# Patient Record
Sex: Male | Born: 1950 | Race: White | Hispanic: No | Marital: Single | Smoking: Former smoker
Health system: Southern US, Community
[De-identification: ages and names within clinical notes are randomized; demographics above are authoritative.]

## PROBLEM LIST (undated history)

## (undated) DIAGNOSIS — M109 Gout, unspecified: Secondary | ICD-10-CM

## (undated) HISTORY — PX: POPLITEAL SYNOVIAL CYST EXCISION: SUR555

---

## 2017-04-03 ENCOUNTER — Observation Stay
Admission: EM | Admit: 2017-04-03 | Discharge: 2017-04-04 | Disposition: A | Discharge status: Home / Self Care | Payer: Medicaid Other | Attending: Specialist | Admitting: Specialist

## 2017-04-03 ENCOUNTER — Encounter: Payer: Self-pay | Admitting: Radiology

## 2017-04-03 ENCOUNTER — Emergency Department: Payer: Medicaid Other

## 2017-04-03 DIAGNOSIS — I200 Unstable angina: Secondary | ICD-10-CM | POA: Diagnosis present

## 2017-04-03 DIAGNOSIS — M109 Gout, unspecified: Secondary | ICD-10-CM | POA: Diagnosis not present

## 2017-04-03 DIAGNOSIS — I700 Atherosclerosis of aorta: Secondary | ICD-10-CM | POA: Diagnosis not present

## 2017-04-03 DIAGNOSIS — R0789 Other chest pain: Secondary | ICD-10-CM | POA: Diagnosis not present

## 2017-04-03 DIAGNOSIS — R0602 Shortness of breath: Secondary | ICD-10-CM

## 2017-04-03 DIAGNOSIS — R911 Solitary pulmonary nodule: Secondary | ICD-10-CM | POA: Insufficient documentation

## 2017-04-03 DIAGNOSIS — I350 Nonrheumatic aortic (valve) stenosis: Secondary | ICD-10-CM | POA: Insufficient documentation

## 2017-04-03 DIAGNOSIS — R600 Localized edema: Secondary | ICD-10-CM | POA: Insufficient documentation

## 2017-04-03 DIAGNOSIS — Z87891 Personal history of nicotine dependence: Secondary | ICD-10-CM | POA: Insufficient documentation

## 2017-04-03 DIAGNOSIS — R079 Chest pain, unspecified: Secondary | ICD-10-CM | POA: Diagnosis present

## 2017-04-03 HISTORY — DX: Gout, unspecified: M10.9

## 2017-04-03 LAB — BASIC METABOLIC PANEL
ANION GAP: 8 (ref 5–15)
BUN: 13 mg/dL (ref 6–20)
CALCIUM: 9.1 mg/dL (ref 8.9–10.3)
CO2: 24 mmol/L (ref 22–32)
CREATININE: 0.77 mg/dL (ref 0.61–1.24)
Chloride: 102 mmol/L (ref 101–111)
GFR calc Af Amer: >60 mL/min (ref >60)
GLUCOSE: 100 mg/dL — AB (ref 65–99)
Potassium: 3.7 mmol/L (ref 3.5–5.1)
Sodium: 134 mmol/L — ABNORMAL LOW (ref 135–145)

## 2017-04-03 LAB — CBC
HCT: 43.3 % (ref 40.0–52.0)
HEMOGLOBIN: 15.4 g/dL (ref 13.0–18.0)
MCH: 34.5 pg — ABNORMAL HIGH (ref 26.0–34.0)
MCHC: 35.6 g/dL (ref 32.0–36.0)
MCV: 97 fL (ref 80.0–100.0)
Platelets: 160 10*3/uL (ref 150–440)
RBC: 4.47 MIL/uL (ref 4.40–5.90)
RDW: 14.7 % — ABNORMAL HIGH (ref 11.5–14.5)
WBC: 5.5 10*3/uL (ref 3.8–10.6)

## 2017-04-03 LAB — TROPONIN I

## 2017-04-03 LAB — BRAIN NATRIURETIC PEPTIDE: B Natriuretic Peptide: 345 pg/mL — ABNORMAL HIGH (ref 0.0–100.0)

## 2017-04-03 MED ORDER — IOPAMIDOL (ISOVUE-370) INJECTION 76%
75.0000 mL | Freq: Once | INTRAVENOUS | Status: AC | PRN
Start: 2017-04-03 — End: 2017-04-03
  Administered 2017-04-03: 75 mL via INTRAVENOUS

## 2017-04-03 MED ORDER — ASPIRIN 81 MG PO TABS: 81.0000 mg | ORAL_TABLET | Freq: Every day | ORAL | 0 refills | Status: DC

## 2017-04-03 MED ORDER — FUROSEMIDE 20 MG PO TABS: 20.0000 mg | ORAL_TABLET | Freq: Every day | ORAL | 1 refills | Status: DC

## 2017-04-03 MED ORDER — NITROGLYCERIN 0.4 MG SL SUBL
0.4000 mg | SUBLINGUAL_TABLET | SUBLINGUAL | Status: DC | PRN
  Administered 2017-04-03 – 2017-04-04 (×2): 0.4 mg via SUBLINGUAL
  Filled 2017-04-03 (×2): qty 1

## 2017-04-03 MED ORDER — NITROGLYCERIN 0.4 MG SL SUBL: 0.4000 mg | SUBLINGUAL_TABLET | SUBLINGUAL | 0 refills | Status: DC | PRN

## 2017-04-03 MED ORDER — ASPIRIN 81 MG PO CHEW
324.0000 mg | CHEWABLE_TABLET | Freq: Once | ORAL | Status: AC
  Administered 2017-04-03: 324 mg via ORAL
  Filled 2017-04-03: qty 4

## 2017-04-03 NOTE — ED Triage Notes (Addendum)
Patient reports chest pain off/on for several days.  Reports it has woken him up during the night.  Reports pain worse with taking deep breath.  Also reports bilateral foot swelling.

## 2017-04-03 NOTE — ED Notes (Signed)
ED Provider at bedside. 

## 2017-04-03 NOTE — ED Notes (Signed)
Patient reports continued relief of symptoms

## 2017-04-03 NOTE — ED Provider Notes (Signed)
[facility] Emergency Department Provider Note  ____________________________________________  Time seen: Approximately 9:59 PM  I have reviewed the triage vital signs and the nursing notes.   HISTORY  Chief Complaint Chest Pain   HPI Dylan Ford is a 66 y.o. male with no significant PMH who presents for evaluation of CP and SOB. Patient reports several weeks of episodes of severe sudden SOB associated with L sided CP. These episodes are more frequent and night time and usually wake him up from his sleep. He has been sleeping in a recliner for the last several nights however continues to have these episodes. They also can happen during the day.He describes this episode as a sudden shortness of breath, reports that he panics and tries to take a deep breath and then developed a dull mild pain in the left side of his chest. The episode lasted until he is able to calm himself down. He has also noticed progressively worsening swelling of his bilateral lower extremities. Currently he endorses mild shortness of breath but no chest pain. No cough, no hemoptysis, no history of heart failure, patient is a former smoker, has family history of ischemic heart disease in his mother, no family history of blood clots, no recent travel or immobilization, no pain in his legs.  History reviewed. No pertinent past medical history.  There are no active problems to display for this patient.   No past surgical history on file.  Prior to Admission medications   Not on File    Allergies Patient has no known allergies.  No family history on file.  Social History Social History  Substance Use Topics  . Smoking status: Not on file  . Smokeless tobacco: Not on file  . Alcohol use Not on file    Review of Systems  Constitutional: Negative for fever. Eyes: Negative for visual changes. ENT: Negative for sore throat. Neck: No neck pain  Cardiovascular: + chest  pain. Respiratory: + shortness of breath. Gastrointestinal: Negative for abdominal pain, vomiting or diarrhea. Genitourinary: Negative for dysuria. Musculoskeletal: Negative for back pain. + b/l edema Skin: Negative for rash. Neurological: Negative for headaches, weakness or numbness. Psych: No SI or HI  ____________________________________________   PHYSICAL EXAM:  VITAL SIGNS: ED Triage Vitals  Enc Vitals Group     BP 04/03/17 2117 (!) 165/79     Pulse Rate 04/03/17 2117 75     Resp 04/03/17 2117 20     Temp 04/03/17 2117 97.9 F (36.6 C)     Temp Source 04/03/17 2117 Oral     SpO2 04/03/17 2117 99 %     Weight 04/03/17 2118 170 lb (77.1 kg)     Height 04/03/17 2118 5\' 6"  (1.676 m)     Head Circumference --      Peak Flow --      Pain Score 04/03/17 2117 4     Pain Loc --      Pain Edu? --      Excl. in GC? --     Constitutional: Alert and oriented. Well appearing and in no apparent distress. HEENT:      Head: Normocephalic and atraumatic.         Eyes: Conjunctivae are normal. Sclera is non-icteric.       Mouth/Throat: Mucous membranes are moist.       Neck: Supple with no signs of meningismus. Cardiovascular: Regular rate and rhythm. No murmurs, gallops, or rubs. 2+ symmetrical distal pulses are present in  all extremities. JVD elevated to angle of the jaw. Respiratory: Normal respiratory effort. Lungs are clear to auscultation bilaterally. No wheezes, crackles, or rhonchi.  Gastrointestinal: Soft, non tender, and non distended with positive bowel sounds. No rebound or guarding. Genitourinary: No CVA tenderness. Musculoskeletal: 2+ pitting edema bilateral lower extremity  Neurologic: Normal speech and language. Face is symmetric. Moving all extremities. No gross focal neurologic deficits are appreciated. Skin: Skin is warm, dry and intact. No rash noted. Psychiatric: Mood and affect are normal. Speech and behavior are  normal.  ____________________________________________   LABS (all labs ordered are listed, but only abnormal results are displayed)  Labs Reviewed  BASIC METABOLIC PANEL - Abnormal; Notable for the following:       Result Value   Sodium 134 (*)    Glucose, Bld 100 (*)    All other components within normal limits  CBC - Abnormal; Notable for the following:    MCH 34.5 (*)    RDW 14.7 (*)    All other components within normal limits  BRAIN NATRIURETIC PEPTIDE - Abnormal; Notable for the following:    B Natriuretic Peptide 345.0 (*)    All other components within normal limits  TROPONIN I  TROPONIN I   ____________________________________________  EKG  ED ECG REPORT I, Nita Sickle, the attending physician, personally viewed and interpreted this ECG.  Normal sinus rhythm, rate of 72, normal intervals, normal axis, T-wave inversions in inferior leads, no ST elevation. No prior for comparison.   22:12 - normal sinus rhythm, rate of 71, normal intervals, normal axis, persistent T-wave inversions in lateral leads with no ST elevation. Unchanged from earlier one. ____________________________________________  RADIOLOGY  CXR: Negative   CTA chest: Negative ____________________________________________   PROCEDURES  Procedure(s) performed: None Procedures Critical Care performed:  None ____________________________________________   INITIAL IMPRESSION / ASSESSMENT AND PLAN / ED COURSE  66 y.o. male with no significant PMH who presents for evaluation of CP and SOB. Patient reports several weeks of episodes of severe sudden SOB associated with L sided CP x few weeks. Patient has elevated JVD and b/l leg edema however lungs are clear to auscultation. Ddx ACS, CHF, renal failure, PE, pericardial effusion. BP elevated, will give sublingual nitro and repeat EKG. Will get CXR, labs.  Clinical Course as of Apr 03 2342  Sat Apr 03, 2017  2218 Patient reports full resolution of  SOB after one sublingual nitro. Repeat EKG with no changes and showing persistent TWI in lateral leads. Labs pending. CXR with no evidence of pulmonary edema.   [CV]    Clinical Course User Index [CV] Don Perking, [place], MD    _________________________ 11:36 PM on 04/03/2017 -----------------------------------------  Labs showing elevated BNP at 345. Chest x-ray with no evidence of pulmonary edema cardiomegaly. First troponin is negative. Patient remains pain-free. CTA with no evidence of PE or pericardial effusion. Will admit for unstable angina for stress test and ECHO.   Pertinent labs & imaging results that were available during my care of the patient were reviewed by me and considered in my medical decision making (see chart for details).    ____________________________________________   FINAL CLINICAL IMPRESSION(S) / ED DIAGNOSES  Final diagnoses:  Bilateral lower extremity edema  Shortness of breath  Unstable angina (HCC)      NEW MEDICATIONS STARTED DURING THIS VISIT:  There are no discharge medications for this patient.    Note:  This document was prepared using Dragon voice recognition software and may include unintentional dictation  errors.    Don Perking, [place], MD 04/03/17 [phone]

## 2017-04-03 NOTE — ED Notes (Signed)
Patient complete resolution of symptoms (SOB)

## 2017-04-03 NOTE — ED Notes (Signed)
Patient transported to X-ray 

## 2017-04-04 ENCOUNTER — Encounter: Payer: Self-pay | Admitting: Internal Medicine

## 2017-04-04 ENCOUNTER — Observation Stay
Admit: 2017-04-04 | Discharge: 2017-04-04 | Disposition: A | Discharge status: Home / Self Care | Payer: Medicaid Other | Attending: Internal Medicine | Admitting: Internal Medicine

## 2017-04-04 DIAGNOSIS — R079 Chest pain, unspecified: Secondary | ICD-10-CM | POA: Diagnosis present

## 2017-04-04 LAB — CBC
HEMATOCRIT: 41.3 % (ref 40.0–52.0)
Hemoglobin: 14.9 g/dL (ref 13.0–18.0)
MCH: 35.2 pg — ABNORMAL HIGH (ref 26.0–34.0)
MCHC: 36.1 g/dL — ABNORMAL HIGH (ref 32.0–36.0)
MCV: 97.6 fL (ref 80.0–100.0)
Platelets: 155 10*3/uL (ref 150–440)
RBC: 4.23 MIL/uL — ABNORMAL LOW (ref 4.40–5.90)
RDW: 14.6 % — ABNORMAL HIGH (ref 11.5–14.5)
WBC: 4.7 10*3/uL (ref 3.8–10.6)

## 2017-04-04 LAB — TROPONIN I

## 2017-04-04 LAB — BASIC METABOLIC PANEL
ANION GAP: 7 (ref 5–15)
BUN: 10 mg/dL (ref 6–20)
CHLORIDE: 105 mmol/L (ref 101–111)
CO2: 25 mmol/L (ref 22–32)
Calcium: 8.6 mg/dL — ABNORMAL LOW (ref 8.9–10.3)
Creatinine, Ser: 0.74 mg/dL (ref 0.61–1.24)
GFR calc non Af Amer: >60 mL/min (ref >60)
GLUCOSE: 113 mg/dL — AB (ref 65–99)
POTASSIUM: 3.8 mmol/L (ref 3.5–5.1)
Sodium: 137 mmol/L (ref 135–145)

## 2017-04-04 LAB — ECHOCARDIOGRAM COMPLETE
Height: 66 in
WEIGHTICAEL: 2660.8 [oz_av]

## 2017-04-04 MED ORDER — ENOXAPARIN SODIUM 40 MG/0.4ML ~~LOC~~ SOLN
40.0000 mg | SUBCUTANEOUS | Status: DC
  Administered 2017-04-04: 40 mg via SUBCUTANEOUS
  Filled 2017-04-04: qty 0.4

## 2017-04-04 MED ORDER — OXYCODONE HCL 5 MG PO TABS: 5.0000 mg | ORAL_TABLET | ORAL | Status: DC | PRN

## 2017-04-04 MED ORDER — ACETAMINOPHEN 650 MG RE SUPP: 650.0000 mg | Freq: Four times a day (QID) | RECTAL | Status: DC | PRN

## 2017-04-04 MED ORDER — ASPIRIN 81 MG PO TABS: 81.0000 mg | ORAL_TABLET | Freq: Every day | ORAL | 1 refills | Status: AC

## 2017-04-04 MED ORDER — ACETAMINOPHEN 325 MG PO TABS: 650.0000 mg | ORAL_TABLET | Freq: Four times a day (QID) | ORAL | Status: DC | PRN

## 2017-04-04 MED ORDER — ENOXAPARIN SODIUM 40 MG/0.4ML ~~LOC~~ SOLN: 40.0000 mg | SUBCUTANEOUS | Status: DC

## 2017-04-04 MED ORDER — NITROGLYCERIN 0.4 MG SL SUBL: 0.4000 mg | SUBLINGUAL_TABLET | SUBLINGUAL | 0 refills | Status: AC | PRN

## 2017-04-04 MED ORDER — ONDANSETRON HCL 4 MG/2ML IJ SOLN: 4.0000 mg | Freq: Four times a day (QID) | INTRAMUSCULAR | Status: DC | PRN

## 2017-04-04 MED ORDER — ONDANSETRON HCL 4 MG PO TABS: 4.0000 mg | ORAL_TABLET | Freq: Four times a day (QID) | ORAL | Status: DC | PRN

## 2017-04-04 NOTE — ED Notes (Signed)
Eileen StanfordJenna, RN notified of assigned bed

## 2017-04-04 NOTE — H&P (Signed)
[facility] [place] [facility]   PATIENT NAME: Dylan Ford    MR#:  [id number]  DATE OF BIRTH:  1950/12/23  DATE OF ADMISSION:  04/03/2017  PRIMARY CARE PHYSICIAN: Patient, No Pcp Per   REQUESTING/REFERRING PHYSICIAN: Don Perking, MD  CHIEF COMPLAINT:   Chief Complaint  Patient presents with  . Chest Pain    HISTORY OF PRESENT ILLNESS:  Dylan Ford  is a 66 y.o. male who presents with chest pain and shortness of breath. Patient states he's been having orthopnea, lower extremity swelling. In the ED his BNP was elevated. He had some cardiomegaly on imaging. Hospitalists were called for further evaluation  PAST MEDICAL HISTORY:   Past Medical History:  Diagnosis Date  . Gout     PAST SURGICAL HISTORY:   Past Surgical History:  Procedure Laterality Date  . POPLITEAL SYNOVIAL CYST EXCISION      SOCIAL HISTORY:   Social History  Substance Use Topics  . Smoking status: Former Games developer  . Smokeless tobacco: Not on file  . Alcohol use 7.2 oz/week    12 Cans of beer per week    FAMILY HISTORY:   Family History  Problem Relation Age of Onset  . Hypertension Other     DRUG ALLERGIES:  No Known Allergies  MEDICATIONS AT HOME:   Prior to Admission medications   Not on File    REVIEW OF SYSTEMS:  Review of Systems  Constitutional: Negative for chills, fever, malaise/fatigue and weight loss.  HENT: Negative for ear pain, hearing loss and tinnitus.   Eyes: Negative for blurred vision, double vision, pain and redness.  Respiratory: Positive for shortness of breath. Negative for cough and hemoptysis.   Cardiovascular: Positive for chest pain, orthopnea and leg swelling. Negative for palpitations.  Gastrointestinal: Negative for abdominal pain, constipation, diarrhea, nausea and vomiting.  Genitourinary: Negative for dysuria, frequency and hematuria.  Musculoskeletal: Negative for back pain, joint pain and neck pain.  Skin:       No  acne, rash, or lesions  Neurological: Negative for dizziness, tremors, focal weakness and weakness.  Endo/Heme/Allergies: Negative for polydipsia. Does not bruise/bleed easily.  Psychiatric/Behavioral: Negative for depression. The patient is not nervous/anxious and does not have insomnia.      VITAL SIGNS:   Vitals:   04/03/17 2118 04/03/17 2131 04/03/17 2200 04/03/17 2205  BP:   (!) 154/72 (!) 142/84  Pulse:  64 67 69  Resp:  Temp:      TempSrc:      SpO2:  98% 96% 97%  Weight: 77.1 kg (170 lb)     Height:  (1.676 m)      Wt Readings from Last 3 Encounters:  04/03/17 77.1 kg (170 lb)    PHYSICAL EXAMINATION:  Physical Exam  Vitals reviewed. Constitutional: He is oriented to person, place, and time. He appears well-developed and well-nourished. No distress.  HENT:  Head: Normocephalic and atraumatic.  Mouth/Throat: Oropharynx is clear and moist.  Eyes: Pupils are equal, round, and reactive to light. Conjunctivae and EOM are normal. No scleral icterus.  Neck: Normal range of motion. Neck supple. No JVD present. No thyromegaly present.  Cardiovascular: Normal rate, regular rhythm and intact distal pulses.  Exam reveals no gallop and no friction rub.   Murmur heard. Respiratory: Effort normal and breath sounds normal. No respiratory distress. He has no wheezes. He has no rales.  GI: Soft. Bowel sounds are normal. He exhibits no distension.  There is no tenderness.  Musculoskeletal: Normal range of motion. He exhibits edema.  No arthritis, no gout  Lymphadenopathy:    He has no cervical adenopathy.  Neurological: He is alert and oriented to person, place, and time. No cranial nerve deficit.  No dysarthria, no aphasia  Skin: Skin is warm and dry. No rash noted. No erythema.  Psychiatric: He has a normal mood and affect. His behavior is normal. Judgment and thought content normal.    LABORATORY PANEL:   CBC  Recent Labs Lab 04/03/17 2139  WBC 5.5  HGB  15.4  HCT 43.3  PLT 160   ------------------------------------------------------------------------------------------------------------------  Chemistries   Recent Labs Lab 04/03/17 2139  NA 134*  K 3.7  CL 102  CO2 24  GLUCOSE 100*  BUN 13  CREATININE 0.77  CALCIUM 9.1   ------------------------------------------------------------------------------------------------------------------  Cardiac Enzymes  Recent Labs Lab 04/03/17 2139  TROPONINI <0.03   ------------------------------------------------------------------------------------------------------------------  RADIOLOGY:  Dg Chest 2 View  Result Date: 04/03/2017 CLINICAL DATA:  Chest pain EXAM: CHEST  2 VIEW COMPARISON:  None. FINDINGS: Hyperinflation. No focal infiltrate or effusion. Normal cardiomediastinal silhouette with atherosclerosis. No pneumothorax. IMPRESSION: No active cardiopulmonary disease. Electronically Signed   By: Jasmine PangKim  Fujinaga M.D.   On: 04/03/2017 21:55   Ct Angio Chest Pe W And/or Wo Contrast  Result Date: 04/03/2017 CLINICAL DATA:  PE suspected, high pretest prob. Intermittent chest pain for days. EXAM: CT ANGIOGRAPHY CHEST WITH CONTRAST TECHNIQUE: Multidetector CT imaging of the chest was performed using the standard protocol during bolus administration of intravenous contrast. Multiplanar CT image reconstructions and MIPs were obtained to evaluate the vascular anatomy. CONTRAST:  75 cc Isovue 370 IV COMPARISON:  Chest radiograph earlier this day FINDINGS: Cardiovascular: There are no filling defects within the pulmonary arteries to suggest pulmonary embolus. Moderate atherosclerosis of the thoracic aorta and branch vessels. Heart is normal in size. There are coronary artery calcifications. Mediastinum/Nodes: Small mediastinal nodes not enlarged by size criteria. There is a prominent right hilar node measuring 11 mm. The esophagus is decompressed. Visualized thyroid gland is normal. Lungs/Pleura: Moderate  emphysema, apical predominant. There is central bronchial thickening. Perifissural right upper lobe nodule measures 5 mm image 55 series 6. No confluent consolidation. No pulmonary edema or pleural fluid. Upper Abdomen: No acute abnormality. Atherosclerosis of upper abdominal vasculature. Musculoskeletal: There are no acute or suspicious osseous abnormalities. Review of the MIP images confirms the above findings. IMPRESSION: 1. No pulmonary embolus. 2. Aortic Atherosclerosis (ICD10-I70.0) and Emphysema (ICD10-J43.9). Coronary artery calcifications. 3. Perifissural right upper lobe pulmonary nodule is intrapulmonary lymph node. Non-contrast chest CT can be considered in 12 months if patient is high-risk. This recommendation follows the consensus statement: Guidelines for Management of Incidental Pulmonary Nodules Detected on CT Images: From the Fleischner Society 2017; Radiology 2017; 284:228-243. 4. Prominent right hilar node which can be reassessed on follow-up imaging. Electronically Signed   By: Rubye OaksMelanie  Ehinger M.D.   On: 04/03/2017 23:49    EKG:   Orders placed or performed during the hospital encounter of 04/03/17  . EKG 12-Lead  . EKG 12-Lead  . ED EKG within 10 minutes  . ED EKG within 10 minutes    IMPRESSION AND PLAN:  Principal Problem:   Chest pain - cycle cardiac enzymes, get an echocardiogram and a cardiology consult  All the records are reviewed and case discussed with ED provider. Management plans discussed with the patient and/or family.  DVT PROPHYLAXIS: SubQ lovenox  GI PROPHYLAXIS: None  ADMISSION STATUS: Observation  CODE STATUS: Full Code Status History    This patient does not have a recorded code status. Please follow your organizational policy for patients in this situation.      TOTAL TIME TAKING CARE OF THIS PATIENT: 40 minutes.   Caralyn Twining FIELDING 04/04/2017, 12:35 AM  [facility]  Office  [phone]  CC: Primary care physician;  Patient, No Pcp Per  Note:  This document was prepared using Dragon voice recognition software and may include unintentional dictation errors.

## 2017-04-04 NOTE — Discharge Summary (Signed)
[facility] [place] at [facility]   PATIENT NAME: Dylan Ford    MR#:  [id number]  DATE OF BIRTH:  1951/07/07  DATE OF ADMISSION:  04/03/2017 ADMITTING PHYSICIAN: Oralia Manis, MD  DATE OF DISCHARGE: 04/04/2017  1:39 PM  PRIMARY CARE PHYSICIAN: Patient, No Pcp Per    ADMISSION DIAGNOSIS:  Shortness of breath [R06.02] Unstable angina (HCC) [I20.0] Bilateral lower extremity edema [R60.0]  DISCHARGE DIAGNOSIS:  Principal Problem:   Chest pain   SECONDARY DIAGNOSIS:   Past Medical History:  Diagnosis Date  . Gout     HOSPITAL COURSE:   66 year old male with past history of gout who presented to to the hospital due to chest pain.   1. Chest pain - pt. Was observed on tele and 3 sets of cardiac markers checked which were negative. -Patient was seen by cardiology and  Underwent a two-dimensional echocardiogram which showed no wall motion abnormalities and mild LV dysfunction. Patient is currently asymptomatic and therefore not being discharged home. He'll follow-up with cardiology next week for an outpatient stress test. - he was discharged on ASA, Nitro.   DISCHARGE CONDITIONS:   Stable.   CONSULTS OBTAINED:  Treatment Team:  Laurier Nancy, MD  DRUG ALLERGIES:  No Known Allergies  DISCHARGE MEDICATIONS:   Allergies as of 04/04/2017   No Known Allergies     Medication List    TAKE these medications   aspirin 81 MG tablet Take 1 tablet (81 mg total) by mouth daily.   nitroGLYCERIN 0.4 MG SL tablet Commonly known as:  NITROSTAT Place 1 tablet (0.4 mg total) under the tongue every 5 (five) minutes as needed for chest pain.            Discharge Care Instructions        Start     Ordered   04/04/17 0000  aspirin 81 MG tablet  Daily     04/04/17 1035   04/04/17 0000  nitroGLYCERIN (NITROSTAT) 0.4 MG SL tablet  Every 5 min PRN     04/04/17 1035   04/04/17 0000  Activity as tolerated - No restrictions     04/04/17 1035   04/04/17 0000   Diet general     04/04/17 1035        DISCHARGE INSTRUCTIONS:   DIET:  Regular diet  DISCHARGE CONDITION:  Stable  ACTIVITY:  Activity as tolerated  OXYGEN:  Home Oxygen: No.   Oxygen Delivery: room air  DISCHARGE LOCATION:  home   If you experience worsening of your admission symptoms, develop shortness of breath, life threatening emergency, suicidal or homicidal thoughts you must seek medical attention immediately by calling 911 or calling your MD immediately  if symptoms less severe.  You Must read complete instructions/literature along with all the possible adverse reactions/side effects for all the Medicines you take and that have been prescribed to you. Take any new Medicines after you have completely understood and accpet all the possible adverse reactions/side effects.   Please note  You were cared for by a hospitalist during your hospital stay. If you have any questions about your discharge medications or the care you received while you were in the hospital after you are discharged, you can call the unit and asked to speak with the hospitalist on call if the hospitalist that took care of you is not available. Once you are discharged, your primary care physician will handle any further medical issues. Please note that NO REFILLS for any discharge  medications will be authorized once you are discharged, as it is imperative that you return to your primary care physician (or establish a relationship with a primary care physician if you do not have one) for your aftercare needs so that they can reassess your need for medications and monitor your lab values.     Today   No chest pain presently.  No other acute complaints.   VITAL SIGNS:  Blood pressure 132/60, pulse 75, temperature 98 F (36.7 C), resp. rate 17, height 5\' 6"  (1.676 m), weight 75.4 kg (166 lb 4.8 oz), SpO2 98 %.  I/O:   Intake/Output Summary (Last 24 hours) at 04/04/17 1520 Last data filed at  04/04/17 [id number]  Gross per 24 hour  Intake                0 ml  Output              800 ml  Net             -800 ml    PHYSICAL EXAMINATION:  GENERAL:  66 y.o.-year-old patient lying in the bed with no acute distress.  EYES: Pupils equal, round, reactive to light and accommodation. No scleral icterus. Extraocular muscles intact.  HEENT: Head atraumatic, normocephalic. Oropharynx and nasopharynx clear.  NECK:  Supple, no jugular venous distention. No thyroid enlargement, no tenderness.  LUNGS: Normal breath sounds bilaterally, no wheezing, rales,rhonchi. No use of accessory muscles of respiration.  CARDIOVASCULAR: S1, S2 normal. No murmurs, rubs, or gallops.  ABDOMEN: Soft, non-tender, non-distended. Bowel sounds present. No organomegaly or mass.  EXTREMITIES: No pedal edema, cyanosis, or clubbing.  NEUROLOGIC: Cranial nerves II through XII are intact. No focal motor or sensory defecits b/l.  PSYCHIATRIC: The patient is alert and oriented x 3. Good affect.  SKIN: No obvious rash, lesion, or ulcer.   DATA REVIEW:   CBC  Recent Labs Lab 04/04/17 0541  WBC 4.7  HGB 14.9  HCT 41.3  PLT 155    Chemistries   Recent Labs Lab 04/04/17 0541  NA 137  K 3.8  CL 105  CO2 25  GLUCOSE 113*  BUN 10  CREATININE 0.74  CALCIUM 8.6*    Cardiac Enzymes  Recent Labs Lab 04/04/17 0541  TROPONINI <0.03    Microbiology Results  No results found for this or any previous visit.  RADIOLOGY:  Dg Chest 2 View  Result Date: 04/03/2017 CLINICAL DATA:  Chest pain EXAM: CHEST  2 VIEW COMPARISON:  None. FINDINGS: Hyperinflation. No focal infiltrate or effusion. Normal cardiomediastinal silhouette with atherosclerosis. No pneumothorax. IMPRESSION: No active cardiopulmonary disease. Electronically Signed   By: Jasmine PangKim  Fujinaga M.D.   On: 04/03/2017 21:55   Ct Angio Chest Pe W And/or Wo Contrast  Result Date: 04/03/2017 CLINICAL DATA:  PE suspected, high pretest prob. Intermittent chest pain for  days. EXAM: CT ANGIOGRAPHY CHEST WITH CONTRAST TECHNIQUE: Multidetector CT imaging of the chest was performed using the standard protocol during bolus administration of intravenous contrast. Multiplanar CT image reconstructions and MIPs were obtained to evaluate the vascular anatomy. CONTRAST:  75 cc Isovue 370 IV COMPARISON:  Chest radiograph earlier this day FINDINGS: Cardiovascular: There are no filling defects within the pulmonary arteries to suggest pulmonary embolus. Moderate atherosclerosis of the thoracic aorta and branch vessels. Heart is normal in size. There are coronary artery calcifications. Mediastinum/Nodes: Small mediastinal nodes not enlarged by size criteria. There is a prominent right hilar node measuring 11 mm. The esophagus is decompressed.  Visualized thyroid gland is normal. Lungs/Pleura: Moderate emphysema, apical predominant. There is central bronchial thickening. Perifissural right upper lobe nodule measures 5 mm image 55 series 6. No confluent consolidation. No pulmonary edema or pleural fluid. Upper Abdomen: No acute abnormality. Atherosclerosis of upper abdominal vasculature. Musculoskeletal: There are no acute or suspicious osseous abnormalities. Review of the MIP images confirms the above findings. IMPRESSION: 1. No pulmonary embolus. 2. Aortic Atherosclerosis (ICD10-I70.0) and Emphysema (ICD10-J43.9). Coronary artery calcifications. 3. Perifissural right upper lobe pulmonary nodule is intrapulmonary lymph node. Non-contrast chest CT can be considered in 12 months if patient is high-risk. This recommendation follows the consensus statement: Guidelines for Management of Incidental Pulmonary Nodules Detected on CT Images: From the Fleischner Society 2017; Radiology 2017; 284:228-243. 4. Prominent right hilar node which can be reassessed on follow-up imaging. Electronically Signed   By: Rubye Oaks M.D.   On: 04/03/2017 23:49      Management plans discussed with the patient,  family and they are in agreement.  CODE STATUS:     Code Status Orders        Start     Ordered   04/04/17 0155  Full code  Continuous     04/04/17 0154   TOTAL TIME TAKING CARE OF THIS PATIENT: 40 minutes.    Houston Siren M.D on 04/04/2017 at 3:20 PM  Between 7am to 6pm - Pager - [phone]  After 6pm go to [email or web address] - Social research officer, government  [facility]  Office  [phone]  CC: Primary care physician; Patient, No Pcp Per

## 2017-04-04 NOTE — Discharge Instructions (Signed)
Acute Coronary Syndrome °Acute coronary syndrome (ACS) is a serious problem in which there is suddenly not enough blood and oxygen supplied to the heart. ACS may mean that one or more of the blood vessels in your heart (coronary arteries) may be blocked. ACS can result in chest pain or a heart attack (myocardial infarction or MI). °What are the causes? °This condition is caused by atherosclerosis, which is the buildup of fat and cholesterol (plaque) on the inside of the arteries. Over time, the plaque may narrow or block the artery, and this will lessen blood flow to the heart. Plaque can also become weak and break off within a coronary artery to form a clot and cause a sudden blockage. °What increases the risk? °The risk factors of this condition include: °· High cholesterol levels. °· High blood pressure (hypertension). °· Smoking. °· Diabetes. °· Age. °· Family history of chest pain, heart disease, or stroke. °· Lack of exercise. °What are the signs or symptoms? °The most common signs of this condition include: °· Chest pain, which can be: °¨ A crushing or squeezing in the chest. °¨ A tightness, pressure, fullness, or heaviness in the chest. °¨ Present for more than a few minutes, or it can stop and recur. °· Pain in the arms, neck, jaw, or back. °· Unexplained heartburn or indigestion. °· Shortness of breath. °· Nausea. °· Sudden cold sweats. °· Feeling light-headed or dizzy. °Sometimes, this condition has no symptoms. °How is this diagnosed? °ACS may be diagnosed through the following tests: °· Electrocardiogram (ECG). °· Blood tests. °· Coronary angiogram. This is a procedure to look at the coronary arteries to see if there is any blockage. °How is this treated? °Treatment for ACS may include: °· Healthy behavioral changes to reduce or control risk factors. °· Medicine. °· Coronary stenting. A stent helps to keep an artery open. °· Coronary angioplasty. This procedure widens a narrowed or blocked  artery. °· Coronary artery bypass surgery. This will allow your blood to pass the blockage (bypass) to reach your heart. °Follow these instructions at home: °Eating and drinking °· Follow a heart-healthy diet. A dietitian can you help to educate you about healthy food options and changes. °· Use healthy cooking methods such as roasting, grilling, broiling, baking, poaching, steaming, or stir-frying. Talk to a dietitian to learn more about healthy cooking methods. °Medicines °· Take medicines only as directed by your health care provider. °· Do not take the following medicines unless your health care provider approves: °¨ Nonsteroidal anti-inflammatory drugs (NSAIDs), such as ibuprofen, naproxen, or celecoxib. °¨ Vitamin supplements that contain vitamin A, vitamin E, or both. °¨ Hormone replacement therapy that contains estrogen with or without progestin. °· Stop illegal drug use. °Activity °· Follow an exercise program that is approved by your health care provider. °· Plan rest periods when you are fatigued. °Lifestyle °· Do not use any tobacco products, including cigarettes, chewing tobacco, or electronic cigarettes. If you need help quitting, ask your health care provider. °· If you drink alcohol, and your health care provider approves, limit your alcohol intake to no more than 1 drink per day. One drink equals 12 ounces of beer, 5 ounces of wine, or 1½ ounces of hard liquor. °· Learn to manage stress. °· Maintain a healthy weight. Lose weight as approved by your health care provider. °General instructions °· Manage other health conditions, such as hypertension and diabetes, as directed by your health care provider. °· Keep all follow-up visits as directed by your   health care provider. This is important. °· Your health care provider may ask you to monitor your blood pressure. A blood pressure reading consists of a higher number over a lower number, such as 110 over 72, written as 110/72. Ideally, your blood  pressure should be: °¨ Below 140/90 if you have no other medical conditions. °¨ Below 130/80 if you have diabetes or kidney disease. °Get help right away if: °· You have pain in your chest, neck, arm, jaw, stomach, or back that lasts more than a few minutes, is recurring, or is not relieved by taking medicine under your tongue (sublingual nitroglycerin). °· You have profuse sweating without cause. °· You have unexplained: °¨ Heartburn or indigestion. °¨ Shortness of breath or difficulty breathing. °¨ Nausea or vomiting. °¨ Fatigue. °¨ Feelings of nervousness or anxiety. °¨ Weakness. °¨ Diarrhea. °· You have sudden light-headedness or dizziness. °· You faint. °These symptoms may represent a serious problem that is an emergency. Do not wait to see if the symptoms will go away. Get medical help right away. Call your local emergency services (911 in the [place] Do not drive yourself to the clinic or hospital.  °This information is not intended to replace advice given to you by your health care provider. Make sure you discuss any questions you have with your health care provider. °Document Released: 07/13/2005 Document Revised: 12/25/2015 Document Reviewed: 11/14/2013 °Elsevier Interactive Patient Education © 2017 [employer] ° °

## 2017-04-04 NOTE — Progress Notes (Signed)
Discharge instructions reviewed with patient. Patient verbalized understanding. IV removed, pressure dressing applied. NAD noted upon discharge. Locked belongings returned to patient.

## 2017-04-04 NOTE — Plan of Care (Signed)
Problem: Cardiac: Goal: Ability to achieve and maintain adequate cardiovascular perfusion will improve Outcome: Not Progressing Reports of chest pain and shortness of breath.  VSS.  No telemetry changes noted.

## 2017-04-04 NOTE — Consult Note (Signed)
Dylan Ford is a 66 y.o. male  [id number]  Primary Cardiologist: Adrian BlackwaterShaukat Brendon Christoffel Reason for Consultation: Chest pain  HPI: This is a 66 year old white male with history of having intermittent chest pain for the past one day described as pressure type and dull ache. Patient denies any orthopnea PND or syncope.   Review of Systems: No orthopnea PND or syncope   Past Medical History:  Diagnosis Date  . Gout     No prescriptions prior to admission.     Melene Muller. [START ON 04/05/2017] enoxaparin (LOVENOX) injection  40 mg Subcutaneous Q24H    Infusions:   No Known Allergies  Social History   Social History  . Marital status: Single    Spouse name: N/A  . Number of children: N/A  . Years of education: N/A   Occupational History  . Not on file.   Social History Main Topics  . Smoking status: Former Games developermoker  . Smokeless tobacco: Not on file  . Alcohol use 7.2 oz/week    12 Cans of beer per week  . Drug use: No  . Sexual activity: Not on file   Other Topics Concern  . Not on file   Social History Narrative  . No narrative on file    Family History  Problem Relation Age of Onset  . Hypertension Other     PHYSICAL EXAM: Vitals:   04/04/17 0825 04/04/17 1139  BP: 123/74 132/60  Pulse: 65 75  Resp:    Temp: 97.8 F (36.6 C) 98 F (36.7 C)  SpO2: 98%      Intake/Output Summary (Last 24 hours) at 04/04/17 1148 Last data filed at 04/04/17 [id number]  Gross per 24 hour  Intake                0 ml  Output              800 ml  Net             -800 ml    General:  Well appearing. No respiratory difficulty HEENT: normal Neck: supple. no JVD. Carotids 2+ bilat; no bruits. No lymphadenopathy or thryomegaly appreciated. Cor: PMI nondisplaced. Regular rate & rhythm. No rubs, gallops or murmurs. Lungs: clear Abdomen: soft, nontender, nondistended. No hepatosplenomegaly. No bruits or masses. Good bowel sounds. Extremities: no cyanosis, clubbing, rash, edema Neuro:  alert & oriented x 3, cranial nerves grossly intact. moves all 4 extremities w/o difficulty. Affect pleasant.  HQI:ONGEXBECG:Normal sinus rhythm no acute changes  Results for orders placed or performed during the hospital encounter of 04/03/17 (from the past 24 hour(s))  Basic metabolic panel     Status: Abnormal   Collection Time: 04/03/17  9:39 PM  Result Value Ref Range   Sodium 134 (L) 135 - 145 mmol/L   Potassium 3.7 3.5 - 5.1 mmol/L   Chloride 102 101 - 111 mmol/L   CO2 24 22 - 32 mmol/L   Glucose, Bld 100 (H) 65 - 99 mg/dL   BUN 13 6 - 20 mg/dL   Creatinine, Ser 2.840.77 0.61 - 1.24 mg/dL   Calcium 9.1 8.9 - 13.210.3 mg/dL   GFR calc non Af Amer >60 >60 mL/min   GFR calc Af Amer >60 >60 mL/min   Anion gap 8 5 - 15  CBC     Status: Abnormal   Collection Time: 04/03/17  9:39 PM  Result Value Ref Range   WBC 5.5 3.8 - 10.6 K/uL   RBC 4.47 4.40 -  5.90 MIL/uL   Hemoglobin 15.4 13.0 - 18.0 g/dL   HCT 16.1 09.6 - 04.5 %   MCV 97.0 80.0 - 100.0 fL   MCH 34.5 (H) 26.0 - 34.0 pg   MCHC 35.6 32.0 - 36.0 g/dL   RDW 40.9 (H) 81.1 - 91.4 %   Platelets 160 150 - 440 K/uL  Troponin I     Status: None   Collection Time: 04/03/17  9:39 PM  Result Value Ref Range   Troponin I <0.03 <0.03 ng/mL  Brain natriuretic peptide     Status: Abnormal   Collection Time: 04/03/17  9:39 PM  Result Value Ref Range   B Natriuretic Peptide 345.0 (H) 0.0 - 100.0 pg/mL  Troponin I     Status: None   Collection Time: 04/04/17 12:21 AM  Result Value Ref Range   Troponin I <0.03 <0.03 ng/mL  Basic metabolic panel     Status: Abnormal   Collection Time: 04/04/17  5:41 AM  Result Value Ref Range   Sodium 137 135 - 145 mmol/L   Potassium 3.8 3.5 - 5.1 mmol/L   Chloride 105 101 - 111 mmol/L   CO2 25 22 - 32 mmol/L   Glucose, Bld 113 (H) 65 - 99 mg/dL   BUN 10 6 - 20 mg/dL   Creatinine, Ser 7.82 0.61 - 1.24 mg/dL   Calcium 8.6 (L) 8.9 - 10.3 mg/dL   GFR calc non Af Amer >60 >60 mL/min   GFR calc Af Amer >60 >60  mL/min   Anion gap 7 5 - 15  CBC     Status: Abnormal   Collection Time: 04/04/17  5:41 AM  Result Value Ref Range   WBC 4.7 3.8 - 10.6 K/uL   RBC 4.23 (L) 4.40 - 5.90 MIL/uL   Hemoglobin 14.9 13.0 - 18.0 g/dL   HCT 95.6 21.3 - 08.6 %   MCV 97.6 80.0 - 100.0 fL   MCH 35.2 (H) 26.0 - 34.0 pg   MCHC 36.1 (H) 32.0 - 36.0 g/dL   RDW 57.8 (H) 46.9 - 62.9 %   Platelets 155 150 - 440 K/uL  Troponin I     Status: None   Collection Time: 04/04/17  5:41 AM  Result Value Ref Range   Troponin I <0.03 <0.03 ng/mL   Dg Chest 2 View  Result Date: 04/03/2017 CLINICAL DATA:  Chest pain EXAM: CHEST  2 VIEW COMPARISON:  None. FINDINGS: Hyperinflation. No focal infiltrate or effusion. Normal cardiomediastinal silhouette with atherosclerosis. No pneumothorax. IMPRESSION: No active cardiopulmonary disease. Electronically Signed   By: Jasmine Pang M.D.   On: 04/03/2017 21:55   Ct Angio Chest Pe W And/or Wo Contrast  Result Date: 04/03/2017 CLINICAL DATA:  PE suspected, high pretest prob. Intermittent chest pain for days. EXAM: CT ANGIOGRAPHY CHEST WITH CONTRAST TECHNIQUE: Multidetector CT imaging of the chest was performed using the standard protocol during bolus administration of intravenous contrast. Multiplanar CT image reconstructions and MIPs were obtained to evaluate the vascular anatomy. CONTRAST:  75 cc Isovue 370 IV COMPARISON:  Chest radiograph earlier this day FINDINGS: Cardiovascular: There are no filling defects within the pulmonary arteries to suggest pulmonary embolus. Moderate atherosclerosis of the thoracic aorta and branch vessels. Heart is normal in size. There are coronary artery calcifications. Mediastinum/Nodes: Small mediastinal nodes not enlarged by size criteria. There is a prominent right hilar node measuring 11 mm. The esophagus is decompressed. Visualized thyroid gland is normal. Lungs/Pleura: Moderate emphysema, apical predominant. There is  central bronchial thickening. Perifissural  right upper lobe nodule measures 5 mm image 55 series 6. No confluent consolidation. No pulmonary edema or pleural fluid. Upper Abdomen: No acute abnormality. Atherosclerosis of upper abdominal vasculature. Musculoskeletal: There are no acute or suspicious osseous abnormalities. Review of the MIP images confirms the above findings. IMPRESSION: 1. No pulmonary embolus. 2. Aortic Atherosclerosis (ICD10-I70.0) and Emphysema (ICD10-J43.9). Coronary artery calcifications. 3. Perifissural right upper lobe pulmonary nodule is intrapulmonary lymph node. Non-contrast chest CT can be considered in 12 months if patient is high-risk. This recommendation follows the consensus statement: Guidelines for Management of Incidental Pulmonary Nodules Detected on CT Images: From the Fle[employer]ner Society 2017; Radiology 2017; 284:228-243. 4. Prominent right hilar node which can be reassessed on follow-up imaging. Electronically Signed   By: Rubye Oaks M.D.   On: 04/03/2017 23:49     ASSESSMENT AND PLAN: Atypical chest pain with MI being ruled out and echocardiogram showing ejection fraction 55% with moderate aortic stenosis with severely calcified aortic valve and aortic valve area 0.96 cm and mild aortic regurgitation and mild mitral regurgitation. Patient can be discharged home with follow-up stress tests in my office which is scheduled tomorrow already at 9 AM. Patient should ideally show up at 8:45 AM. Thank you very much.  Lamonta Cypress A

## 2017-04-04 NOTE — Progress Notes (Signed)
*  PRELIMINARY RESULTS* Echocardiogram 2D Echocardiogram has been performed.  Dylan Ford Dylan Ford 04/04/2017, 10:03 AM

## 2017-04-27 ENCOUNTER — Other Ambulatory Visit: Payer: Self-pay | Admitting: Cardiovascular Disease

## 2017-04-28 ENCOUNTER — Other Ambulatory Visit: Payer: Self-pay | Admitting: Cardiovascular Disease

## 2017-04-29 ENCOUNTER — Ambulatory Visit: Admit: 2017-04-29 | Payer: Medicare Other | Admitting: Cardiovascular Disease

## 2017-04-29 ENCOUNTER — Ambulatory Visit: Admission: RE | Admit: 2017-04-29 | Payer: Medicare Other | Source: Ambulatory Visit | Admitting: Cardiovascular Disease

## 2017-04-29 ENCOUNTER — Other Ambulatory Visit: Payer: Self-pay | Admitting: Cardiovascular Disease

## 2017-04-29 ENCOUNTER — Encounter: Admission: RE | Payer: Self-pay | Source: Ambulatory Visit

## 2017-04-29 SURGERY — LEFT HEART CATH
Anesthesia: Moderate Sedation | Laterality: Right

## 2017-04-29 SURGERY — LEFT HEART CATH AND CORONARY ANGIOGRAPHY
Anesthesia: Moderate Sedation | Laterality: Left

## 2017-05-04 ENCOUNTER — Ambulatory Visit: Admission: RE | Admit: 2017-05-04 | Payer: Medicare Other | Source: Ambulatory Visit | Admitting: Cardiovascular Disease

## 2017-05-04 ENCOUNTER — Encounter: Admission: RE | Payer: Self-pay | Source: Ambulatory Visit

## 2017-05-04 SURGERY — LEFT HEART CATH AND CORONARY ANGIOGRAPHY
Anesthesia: Moderate Sedation | Laterality: Left

## 2018-03-19 IMAGING — CT CT ANGIO CHEST
2 of 6 series · 18 of 46 positions shown · IV contrast (APPLIED)
Comparison: Chest radiograph earlier this day

CLINICAL DATA: PE suspected, high pretest prob. Intermittent chest
pain for days.

EXAM:
CT ANGIOGRAPHY CHEST WITH CONTRAST
TECHNIQUE: Multidetector CT imaging of the chest was performed using the
standard protocol during bolus administration of intravenous
contrast. Multiplanar CT image reconstructions and MIPs were
obtained to evaluate the vascular anatomy.
CONTRAST:  75 cc Isovue 370 IV

[Series 5: thins · axial · 0.78mm/px · z∈[-440,-136]mm · 16 of 334 slices shown]
[im 15/334  lung]
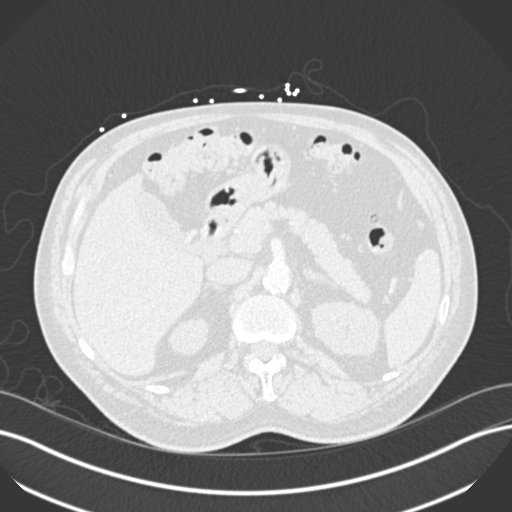
[im 44/334  soft-tissue]
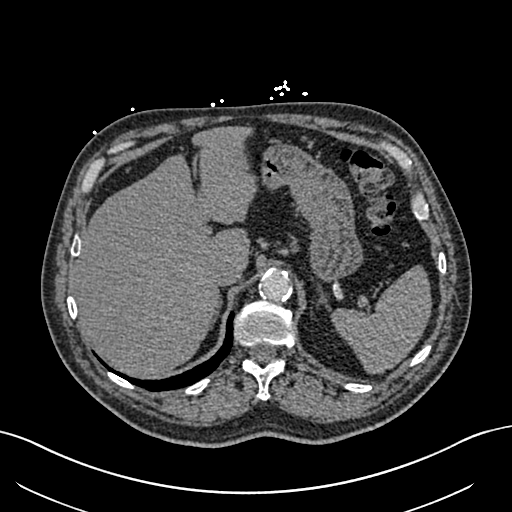
[im 58/334  lung]
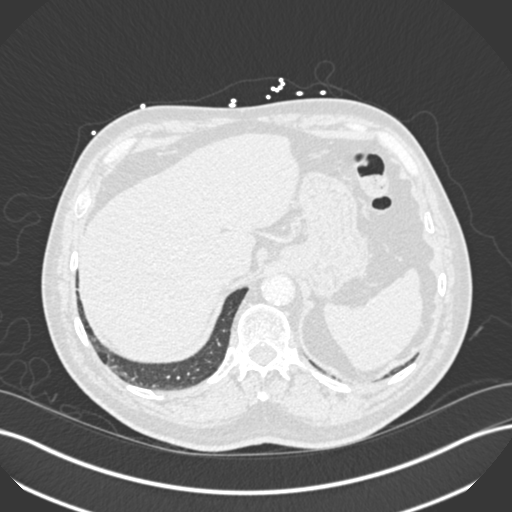
[im 73/334  soft-tissue]
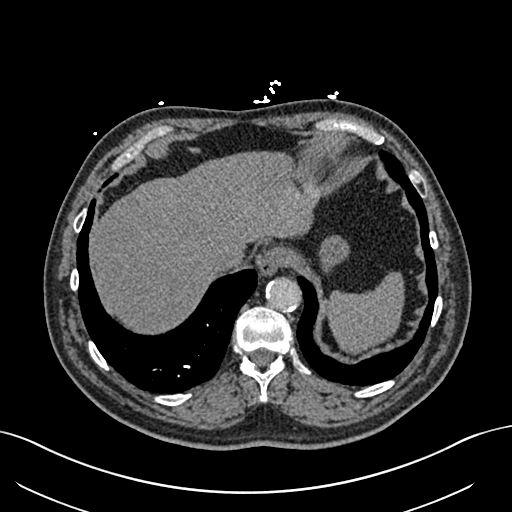
[im 102/334  lung]
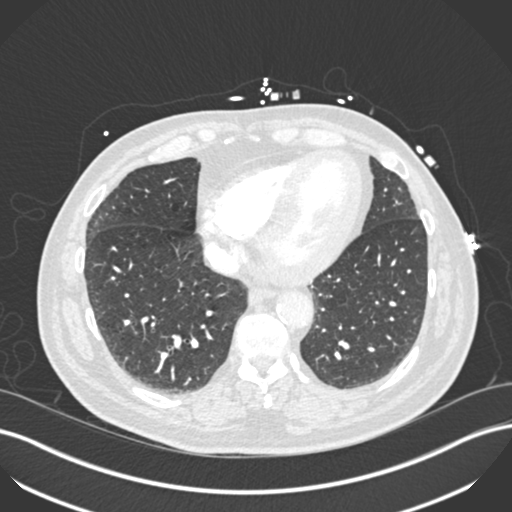
[im 116/334  soft-tissue]
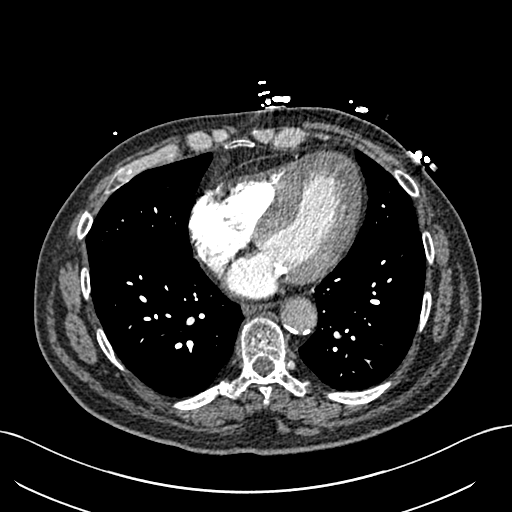
[im 131/334  lung]
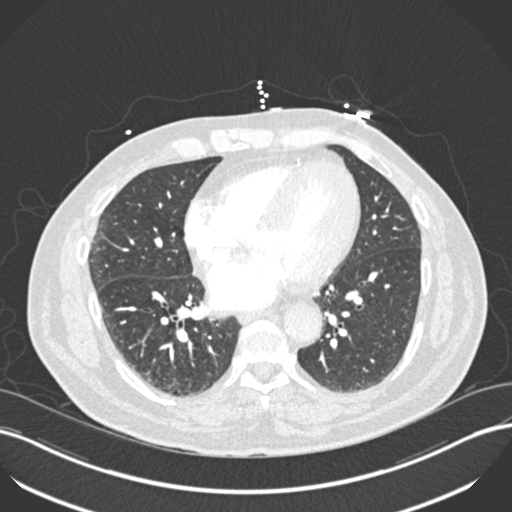
[im 160/334  soft-tissue]
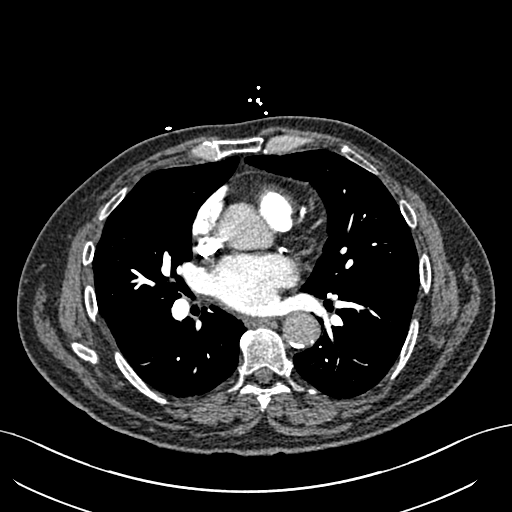
[im 174/334  lung]
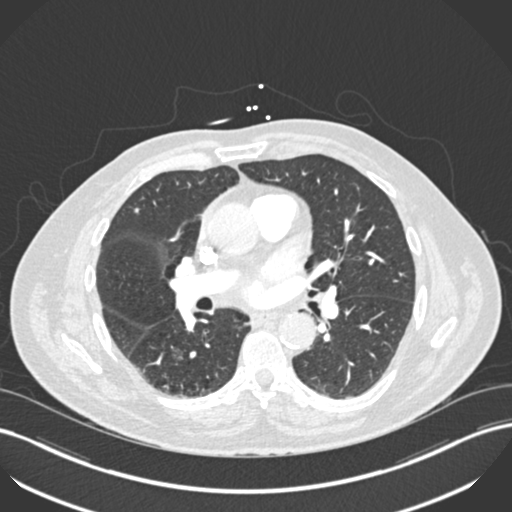
[im 203/334  soft-tissue]
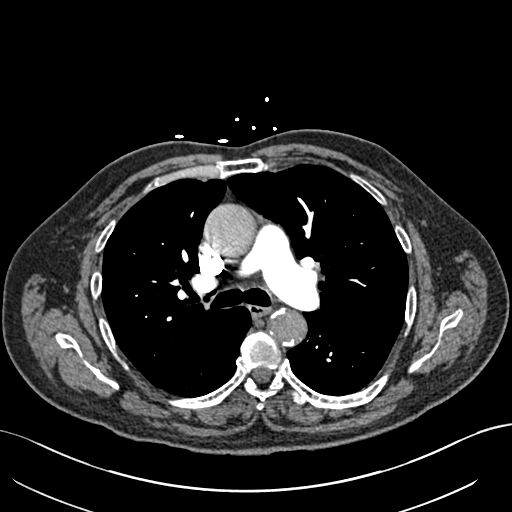
[im 218/334  lung]
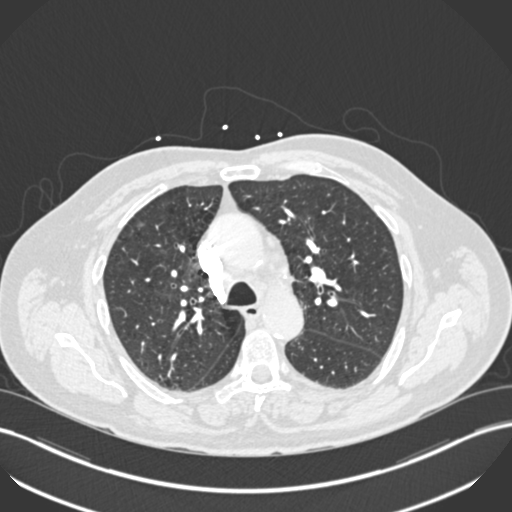
[im 232/334  soft-tissue]
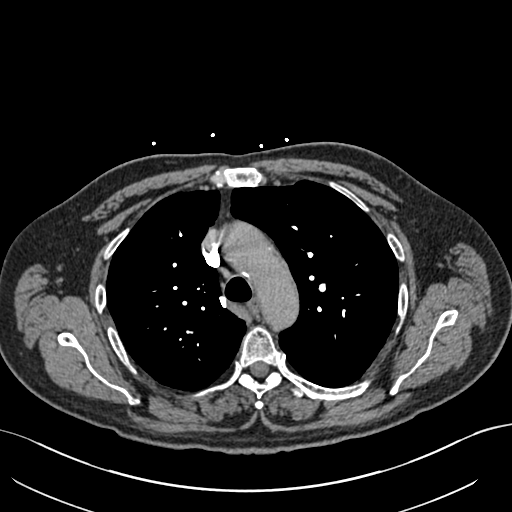
[im 261/334  lung]
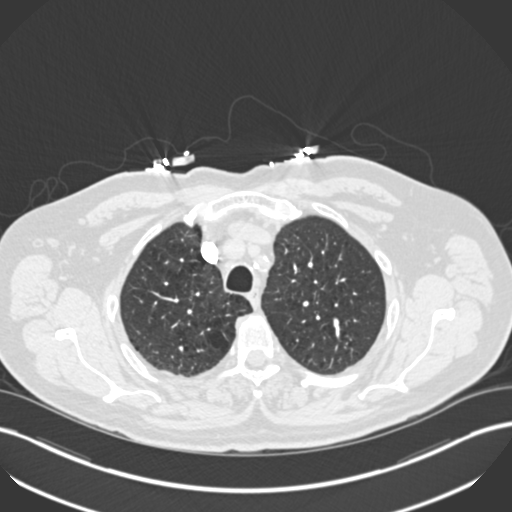
[im 276/334  soft-tissue]
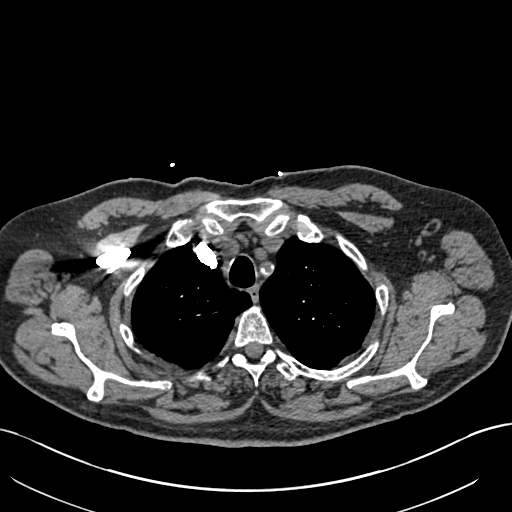
[im 290/334  lung]
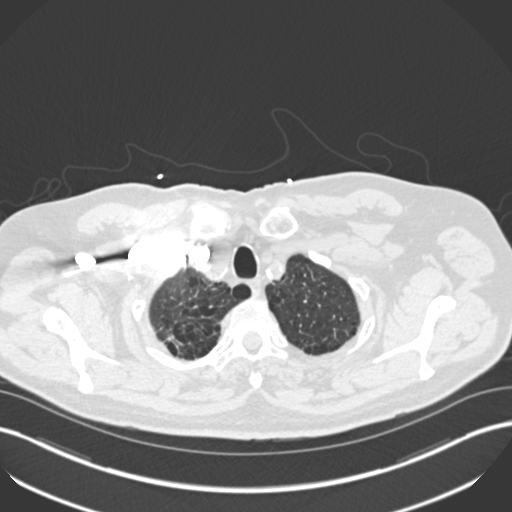
[im 319/334  soft-tissue]
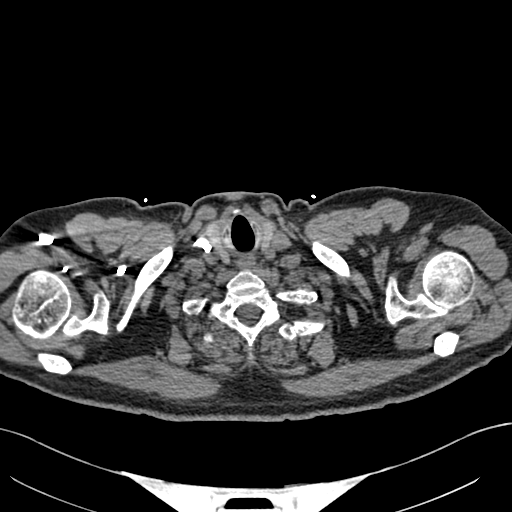

[Series 7: coronal mpr · coronal · 0.65mm/px · 2 of 90 slices shown]
[im 30/90  soft-tissue]
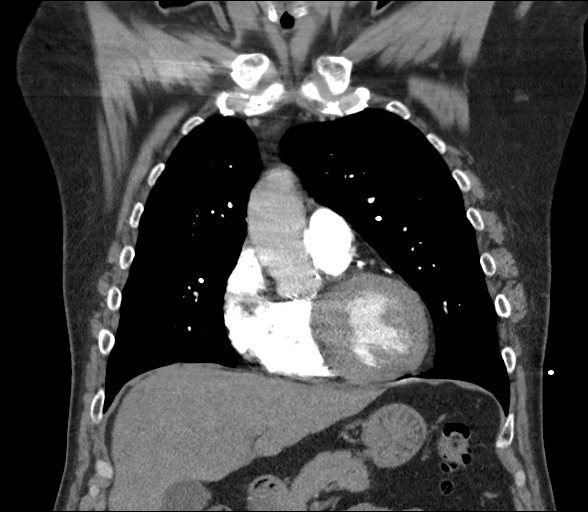
[im 60/90  soft-tissue]
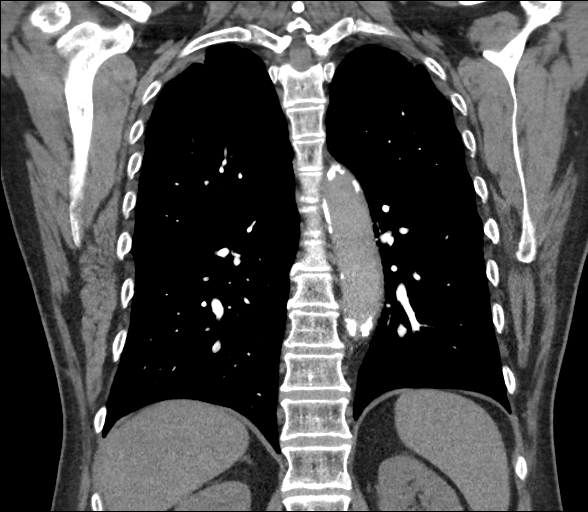

[18 of 46 positions shown; findings below may reference images not displayed]

FINDINGS: Cardiovascular: There are no filling defects within the pulmonary
arteries to suggest pulmonary embolus. Moderate atherosclerosis of
the thoracic aorta and branch vessels. Heart is normal in size.
There are coronary artery calcifications.

Mediastinum/Nodes: Small mediastinal nodes not enlarged by size
criteria. There is a prominent right hilar node measuring 11 mm. The
esophagus is decompressed. Visualized thyroid gland is normal.

Lungs/Pleura: Moderate emphysema, apical predominant. There is
central bronchial thickening. Perifissural right upper lobe nodule
measures 5 mm image 55 series 6. No confluent consolidation. No
pulmonary edema or pleural fluid.

Upper Abdomen: No acute abnormality. Atherosclerosis of upper
abdominal vasculature.

Musculoskeletal: There are no acute or suspicious osseous
abnormalities.

Review of the MIP images confirms the above findings.
IMPRESSION: 1. No pulmonary embolus.
2. Aortic Atherosclerosis ([id number]) and Emphysema ([id number]).
Coronary artery calcifications.
3. Perifissural right upper lobe pulmonary nodule is intrapulmonary
lymph node. Non-contrast chest CT can be considered in 12 months if
patient is high-risk. This recommendation follows the consensus
statement: Guidelines for Management of Incidental Pulmonary Nodules
Detected on CT Images: From the [HOSPITAL] 6082; Radiology
6082; [DATE].
4. Prominent right hilar node which can be reassessed on follow-up
imaging.

## 2018-03-19 IMAGING — CR DG CHEST 2V
1 series · 2 of 2 positions shown · non-contrast
Comparison: None.

CLINICAL DATA: Chest pain

EXAM:
CHEST  2 VIEW

[Series 1: dg chest 2 view · 0.14mm/px · 2 of 2 slices shown]
[im 1/2]
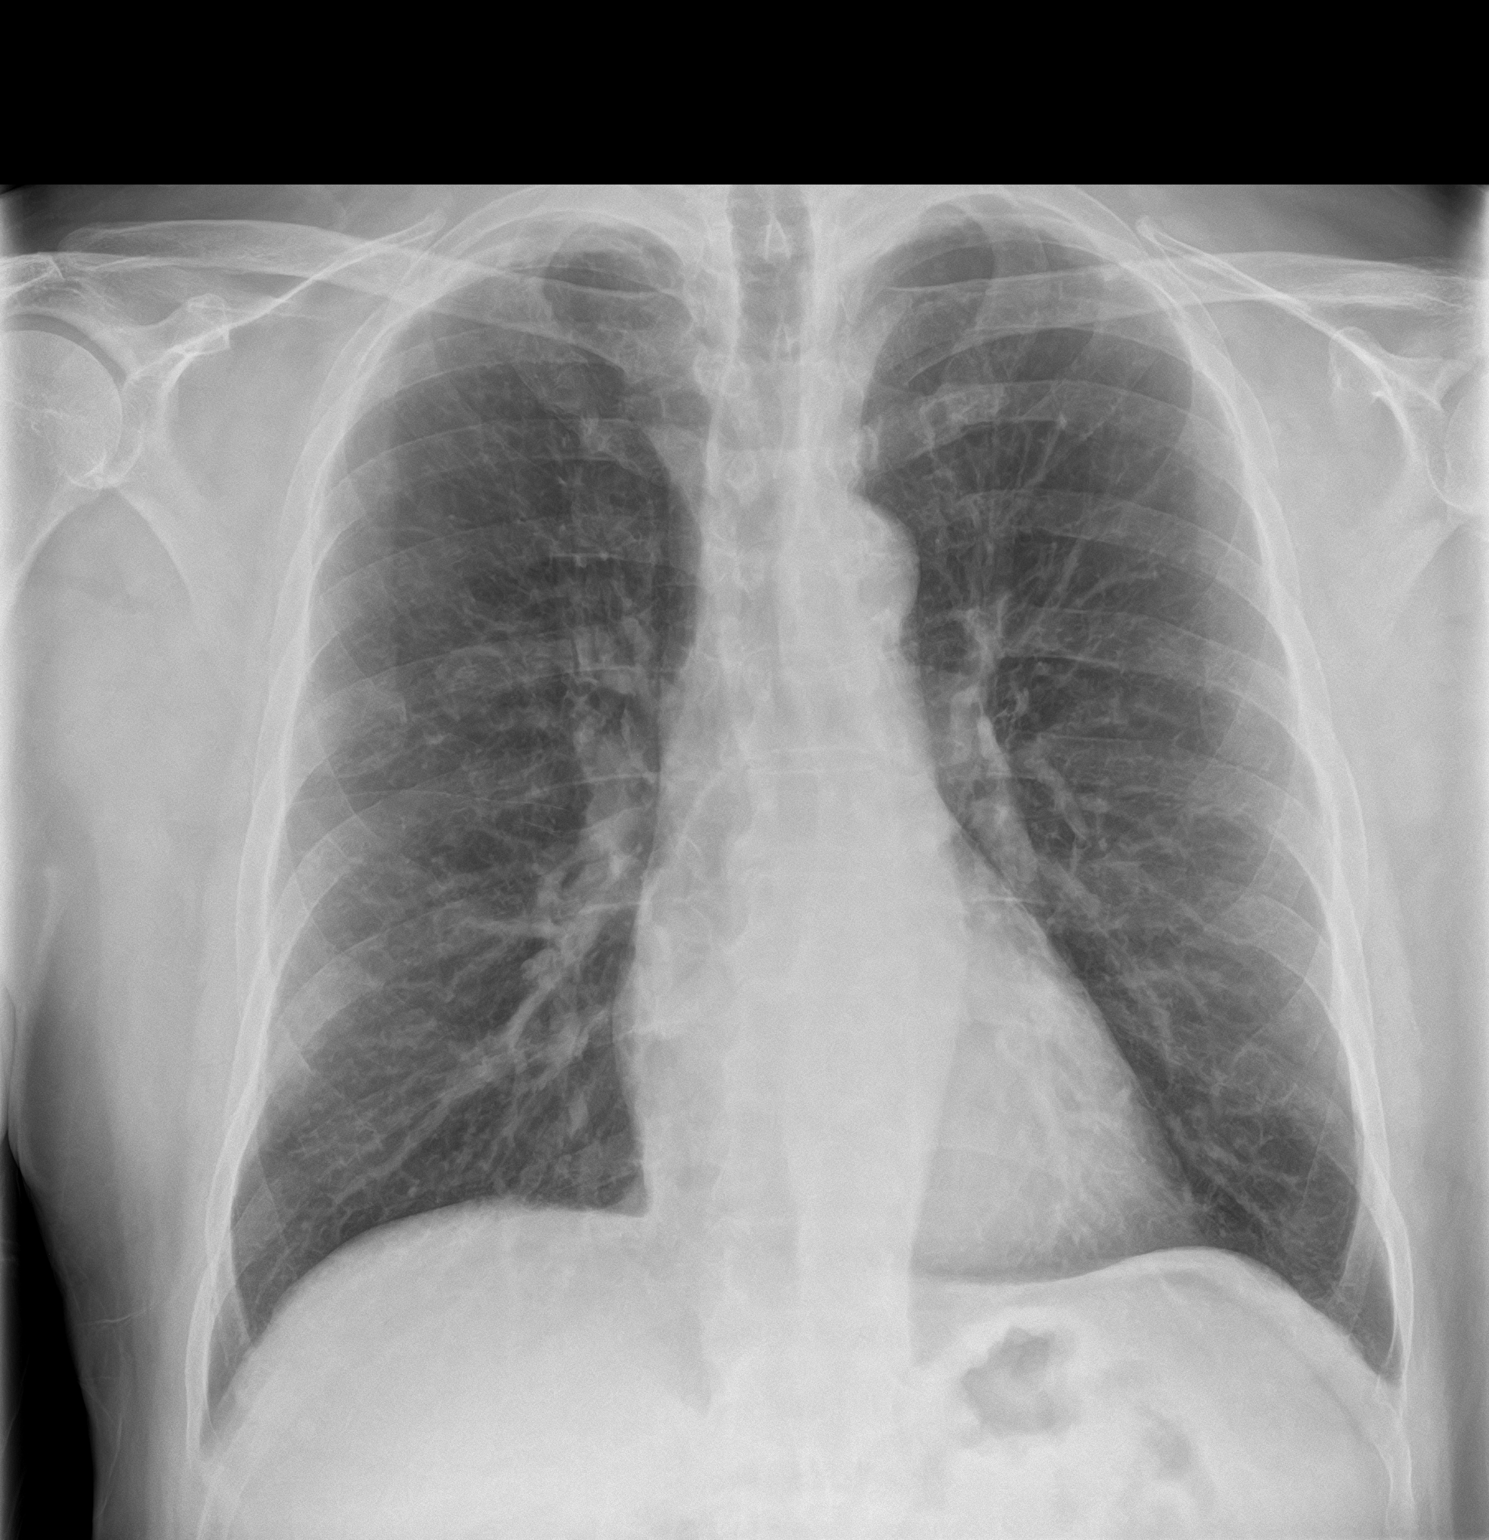
[im 2/2]
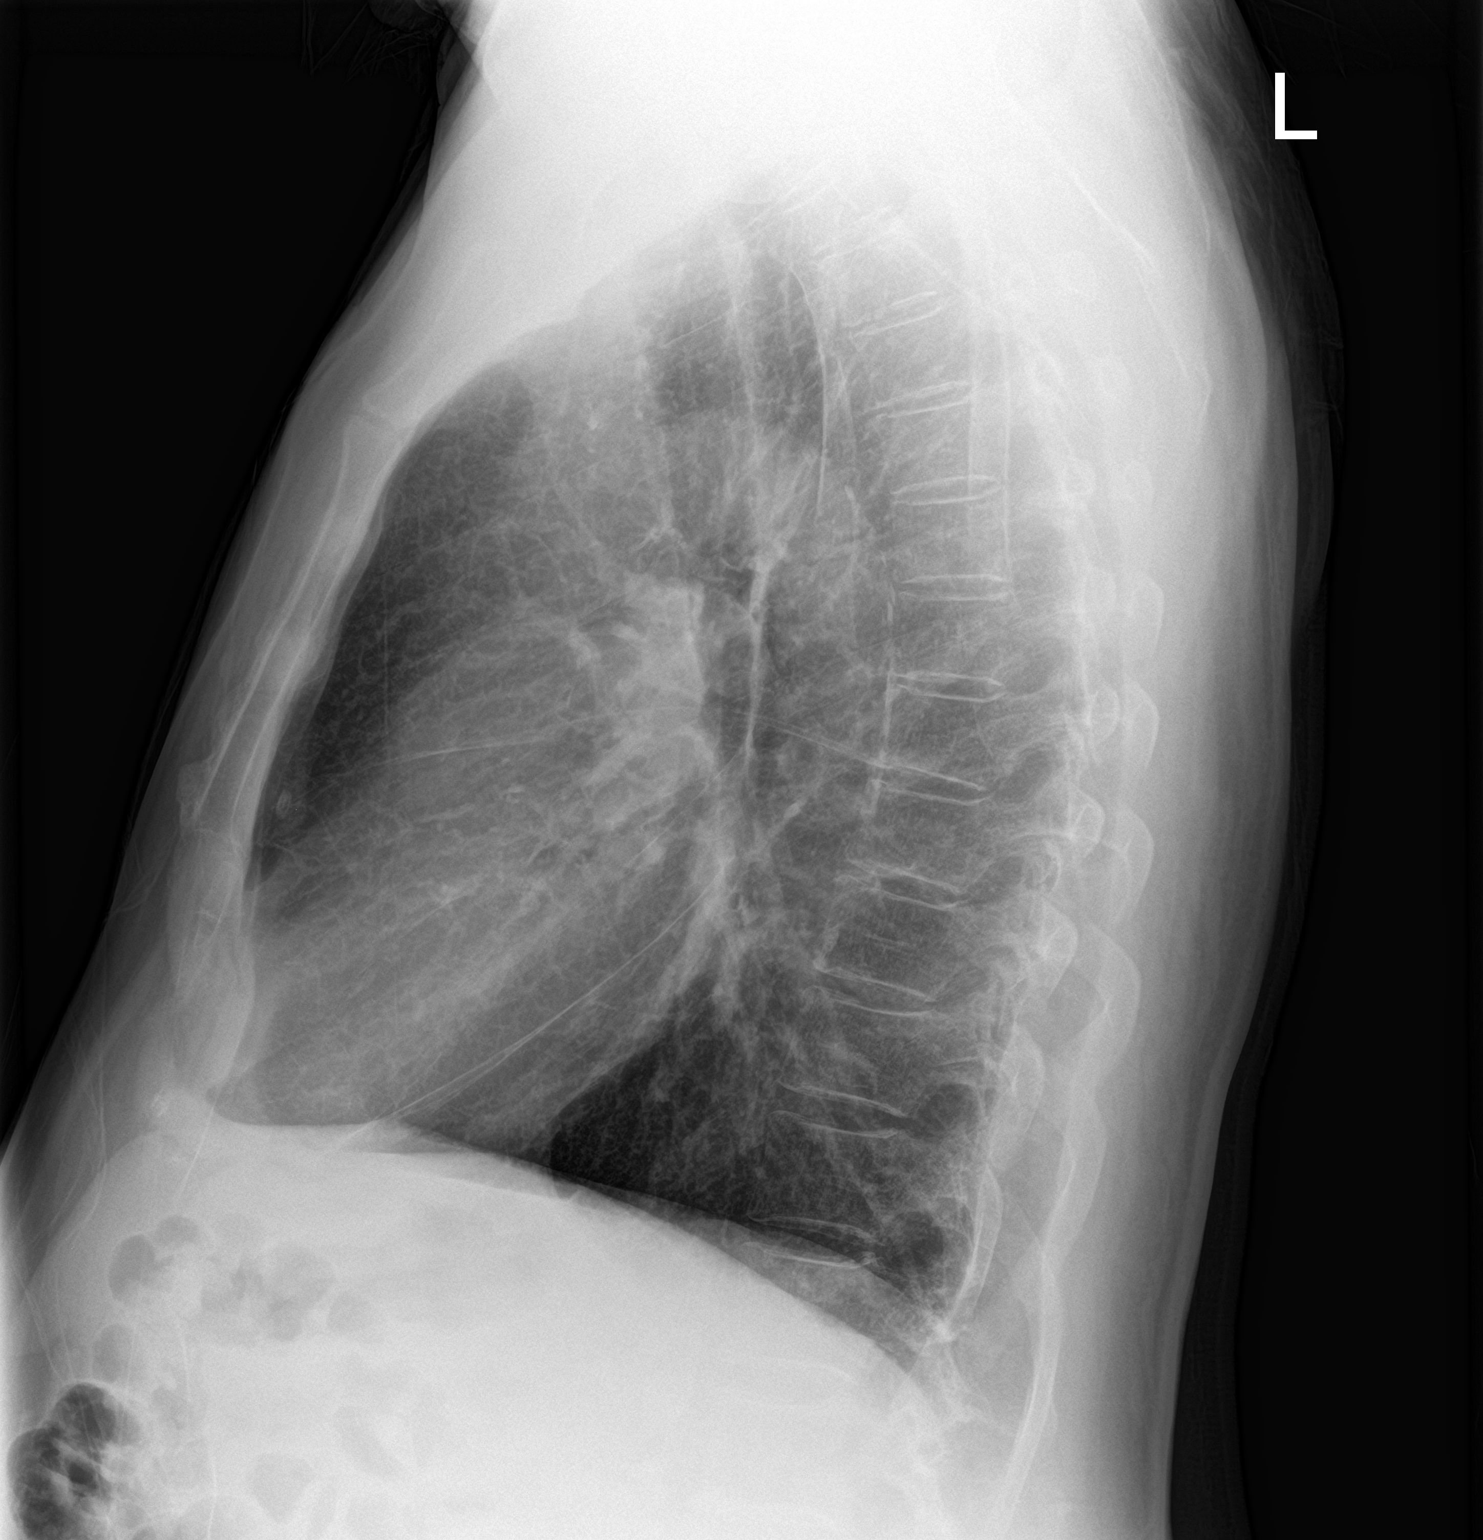

[2 of 2 positions shown; findings below may reference images not displayed]

FINDINGS: Hyperinflation. No focal infiltrate or effusion. Normal
cardiomediastinal silhouette with atherosclerosis. No pneumothorax.
IMPRESSION: No active cardiopulmonary disease.
